# Patient Record
Sex: Male | Born: 1995 | Race: White | Hispanic: No | Marital: Single | Smoking: Never smoker
Health system: Southern US, Community
[De-identification: ages and names within clinical notes are randomized; demographics above are authoritative.]

## PROBLEM LIST (undated history)

## (undated) DIAGNOSIS — J45909 Unspecified asthma, uncomplicated: Secondary | ICD-10-CM

---

## 2018-11-12 ENCOUNTER — Telehealth: Payer: Self-pay

## 2018-11-12 ENCOUNTER — Other Ambulatory Visit: Payer: Self-pay | Admitting: Internal Medicine

## 2018-11-12 DIAGNOSIS — Z20822 Contact with and (suspected) exposure to covid-19: Secondary | ICD-10-CM

## 2018-11-12 NOTE — Telephone Encounter (Signed)
Patient called in requesting MyChart set up assistance - DOB/Address verified - assistance given, patient is able to access MyChart, no further questions.

## 2018-11-13 LAB — NOVEL CORONAVIRUS, NAA: SARS-CoV-2, NAA: DETECTED — AB

## 2019-05-14 ENCOUNTER — Ambulatory Visit: Payer: Self-pay | Attending: Internal Medicine

## 2019-05-14 DIAGNOSIS — Z23 Encounter for immunization: Secondary | ICD-10-CM

## 2019-05-14 NOTE — Progress Notes (Signed)
   Covid-19 Vaccination Clinic  Name:  Adam Woods    MRN: [id number] DOB: 09/07/1995  05/14/2019  Adam Woods was observed post Covid-19 immunization for 15 minutes without incident. He was provided with Vaccine Information Sheet and instruction to access the V-Safe system.   Adam Woods was instructed to call 911 with any severe reactions post vaccine: Marland Kitchen Difficulty breathing  . Swelling of face and throat  . A fast heartbeat  . A bad rash all over body  . Dizziness and weakness   Immunizations Administered    Name Date Dose VIS Date Route   Pfizer COVID-19 Vaccine 05/14/2019  2:07 PM 0.3 mL 01/28/2019 Intramuscular   Manufacturer: [employer], [employer]   Lot: [id number]   NDC: [address]-3

## 2019-06-04 ENCOUNTER — Ambulatory Visit: Payer: Self-pay | Attending: Internal Medicine

## 2019-06-04 DIAGNOSIS — Z23 Encounter for immunization: Secondary | ICD-10-CM

## 2019-12-13 ENCOUNTER — Encounter: Payer: Self-pay | Admitting: *Deleted

## 2019-12-13 ENCOUNTER — Emergency Department: Payer: No Typology Code available for payment source

## 2019-12-13 ENCOUNTER — Emergency Department
Admission: EM | Admit: 2019-12-13 | Discharge: 2019-12-13 | Disposition: A | Discharge status: Home / Self Care | Payer: No Typology Code available for payment source | Attending: Emergency Medicine | Admitting: Emergency Medicine

## 2019-12-13 ENCOUNTER — Other Ambulatory Visit: Payer: Self-pay

## 2019-12-13 DIAGNOSIS — Y92003 Bedroom of unspecified non-institutional (private) residence as the place of occurrence of the external cause: Secondary | ICD-10-CM | POA: Insufficient documentation

## 2019-12-13 DIAGNOSIS — M25562 Pain in left knee: Secondary | ICD-10-CM | POA: Diagnosis not present

## 2019-12-13 DIAGNOSIS — W228XXA Striking against or struck by other objects, initial encounter: Secondary | ICD-10-CM | POA: Diagnosis not present

## 2019-12-13 MED ORDER — TRAMADOL HCL 50 MG PO TABS
50.0000 mg | ORAL_TABLET | Freq: Four times a day (QID) | ORAL | 0 refills | Status: AC | PRN
Start: 2019-12-13 — End: 2020-12-12

## 2019-12-13 MED ORDER — TRAMADOL HCL 50 MG PO TABS
50.0000 mg | ORAL_TABLET | Freq: Once | ORAL | Status: AC
  Administered 2019-12-13: 50 mg via ORAL
  Filled 2019-12-13: qty 1

## 2019-12-13 MED ORDER — IBUPROFEN 800 MG PO TABS
800.0000 mg | ORAL_TABLET | Freq: Once | ORAL | Status: AC
  Administered 2019-12-13: 800 mg via ORAL
  Filled 2019-12-13: qty 1

## 2019-12-13 MED ORDER — IBUPROFEN 800 MG PO TABS: 800.0000 mg | ORAL_TABLET | Freq: Three times a day (TID) | ORAL | 0 refills | Status: AC | PRN

## 2019-12-13 NOTE — ED Triage Notes (Signed)
Pt has left knee pain.  Pt struck knee on bed frame today. Pt reports he tore his acl 6 months ago in left knee.    Pt alert.

## 2019-12-13 NOTE — Discharge Instructions (Signed)
Use crutches and knee immobilizer for comfort. Take pain medications as prescribed. Follow up with orthopedics within 1 week.

## 2019-12-13 NOTE — ED Notes (Signed)
Pt offered crutches but declined, stating he had a pair at home already.

## 2019-12-13 NOTE — ED Provider Notes (Signed)
[facility] Emergency Department Provider Note  ____________________________________________  Time seen: Approximately 4:54 AM  I have reviewed the triage vital signs and the nursing notes.   HISTORY  Chief Complaint Knee Pain   HPI Adam Woods is a 24 y.o. male the history of an ACL sprain who presents for evaluation of acute left knee pain.  Patient reports that earlier today he hit his knee onto the bed frame.  His knee locked up and he heard a pop.  Exactly the same thing that happened to him back in February.  At that time he was seen by orthopedics and had an MRI which showed an ACL sprain but no tear.  Patient is reporting 10 out of 10 sharp constant pain located anteriorly on the left knee which is worse with weightbearing.  He is taken 1500 mg of  Tylenol at 11 PM.  Denies any other injuries.  PMH L ACL sprain  Prior to Admission medications   Medication Sig Start Date End Date Taking? Authorizing Provider  ibuprofen (ADVIL) 800 MG tablet Take 1 tablet (800 mg total) by mouth every 8 (eight) hours as needed. 12/13/19   Nita Sickle, MD  traMADol (ULTRAM) 50 MG tablet Take 1 tablet (50 mg total) by mouth every 6 (six) hours as needed. 12/13/19 12/12/20  Nita Sickle, MD    Allergies Amoxicillin  No family history on file.  Social History Social History   Tobacco Use  . Smoking status: Never Smoker  . Smokeless tobacco: Never Used  Substance Use Topics  . Alcohol use: Not Currently  . Drug use: Not Currently    Review of Systems  Constitutional: Negative for fever. Eyes: Negative for visual changes. ENT: Negative for sore throat. Neck: No neck pain  Cardiovascular: Negative for chest pain. Respiratory: Negative for shortness of breath. Gastrointestinal: Negative for abdominal pain, vomiting or diarrhea. Genitourinary: Negative for dysuria. Musculoskeletal: Negative for back pain. + L knee pain Skin: Negative for  rash. Neurological: Negative for headaches, weakness or numbness. Psych: No SI or HI  ____________________________________________   PHYSICAL EXAM:  VITAL SIGNS: ED Triage Vitals  Enc Vitals Group     BP 12/13/19 0136 (!) 181/111     Pulse Rate 12/13/19 0134 79     Resp 12/13/19 0134 20     Temp 12/13/19 0134 98.9 F (37.2 C)     Temp Source 12/13/19 0134 Oral     SpO2 12/13/19 0134 100 %     Weight 12/13/19 0136 259 lb (117.5 kg)     Height 12/13/19 0136 5\' 10"  (1.778 m)     Head Circumference --      Peak Flow --      Pain Score 12/13/19 0135 8     Pain Loc --      Pain Edu? --      Excl. in GC? --     Constitutional: Alert and oriented. Well appearing and in no apparent distress. HEENT:      Head: Normocephalic and atraumatic.         Eyes: Conjunctivae are normal. Sclera is non-icteric.       Mouth/Throat: Mucous membranes are moist.       Neck: Supple with no signs of meningismus. Cardiovascular: Regular rate and rhythm.  Respiratory: Normal respiratory effort.  Musculoskeletal: Abnormal patella with tenderness to palpation anteriorly, no ligamentous laxity on exam. Strong distal pulses Neurologic: Normal speech and language. Face is symmetric. Moving all extremities. No gross  focal neurologic deficits are appreciated. Skin: Skin is warm, dry and intact. No rash noted. Psychiatric: Mood and affect are normal. Speech and behavior are normal.  ____________________________________________   LABS (all labs ordered are listed, but only abnormal results are displayed)  Labs Reviewed - No data to display ____________________________________________  EKG  none  ____________________________________________  RADIOLOGY  I have personally reviewed the images performed during this visit and I agree with the Radiologist's read.   Interpretation by Radiologist:  DG Knee Complete 4 Views Left  Result Date: 12/13/2019 CLINICAL DATA:  Knee pain EXAM: LEFT KNEE -  COMPLETE 4+ VIEW COMPARISON:  None. FINDINGS: There is an ossicle seen adjacent to the superolateral patellar pole. No acute fracture seen. Trace knee joint effusion is present. IMPRESSION: No acute fracture Ossicle adjacent to the superolateral patella which could represent a bipartite patella versus nonunited prior fracture. Electronically Signed   By: Jonna Clark M.D.   On: 12/13/2019 02:02     ____________________________________________   PROCEDURES  Procedure(s) performed: None Procedures Critical Care performed:  None ____________________________________________   INITIAL IMPRESSION / ASSESSMENT AND PLAN / ED COURSE  24 y.o. male the history of an ACL sprain who presents for evaluation of acute left knee pain.  No ligamentous laxity on exam, patient has tenderness anteriorly.  X-ray visualized by me with no acute dislocation or fracture, confirmed by radiology.  Old medical records reviewed including a visit to orthopedics on February 2021 and the results of the MRI which showed no signs of ligamentous tear.  Patient was placed on a knee immobilizer and given crutches.  Will provide prescription of for 800 mg of ibuprofen and a short course of tramadol.  Work note provided.  Recommended follow-up with his orthopedist.  Discussed my standard return precautions.       _____________________________________________ Please note:  Patient was evaluated in Emergency Department today for the symptoms described in the history of present illness. Patient was evaluated in the context of the global COVID-19 pandemic, which necessitated consideration that the patient might be at risk for infection with the SARS-CoV-2 virus that causes COVID-19. Institutional protocols and algorithms that pertain to the evaluation of patients at risk for COVID-19 are in a state of rapid change based on information released by regulatory bodies including the CDC and federal and state organizations. These policies and  algorithms were followed during the patient's care in the ED.  Some ED evaluations and interventions may be delayed as a result of limited staffing during the pandemic.   [place] Controlled Substance Database was reviewed by me. ____________________________________________   FINAL CLINICAL IMPRESSION(S) / ED DIAGNOSES   Final diagnoses:  Acute pain of left knee      NEW MEDICATIONS STARTED DURING THIS VISIT:  ED Discharge Orders         Ordered    traMADol (ULTRAM) 50 MG tablet  Every 6 hours PRN        12/13/19 0453    ibuprofen (ADVIL) 800 MG tablet  Every 8 hours PRN        12/13/19 0453           Note:  This document was prepared using Dragon voice recognition software and may include unintentional dictation errors.    Don Perking, [place], MD 12/13/19 0500

## 2020-05-22 ENCOUNTER — Other Ambulatory Visit: Payer: Self-pay

## 2020-05-22 ENCOUNTER — Ambulatory Visit
Admission: EM | Admit: 2020-05-22 | Discharge: 2020-05-22 | Disposition: A | Discharge status: Home / Self Care | Payer: No Typology Code available for payment source | Attending: Sports Medicine | Admitting: Sports Medicine

## 2020-05-22 DIAGNOSIS — J069 Acute upper respiratory infection, unspecified: Secondary | ICD-10-CM

## 2020-05-22 DIAGNOSIS — R509 Fever, unspecified: Secondary | ICD-10-CM

## 2020-05-22 DIAGNOSIS — R519 Headache, unspecified: Secondary | ICD-10-CM

## 2020-05-22 DIAGNOSIS — R059 Cough, unspecified: Secondary | ICD-10-CM

## 2020-05-22 DIAGNOSIS — R0981 Nasal congestion: Secondary | ICD-10-CM

## 2020-05-22 MED ORDER — FLUTICASONE PROPIONATE 50 MCG/ACT NA SUSP: 2.0000 | Freq: Every day | NASAL | 0 refills | Status: DC

## 2020-05-22 MED ORDER — BENZONATATE 100 MG PO CAPS: 200.0000 mg | ORAL_CAPSULE | Freq: Three times a day (TID) | ORAL | 0 refills | Status: DC | PRN

## 2020-05-22 NOTE — Discharge Instructions (Signed)
You have a viral upper respiratory infection with headache, nasal congestion and stuffiness, and a dry cough.  He also have a little bit of ear fullness. Please use your albuterol as prescribed. I sent in a medication for your cough called Tessalon Perles.  I also sent in a medication for your nasal congestion called Flonase. Please see your educational handouts are provided.  If your symptoms persist please see your primary care provider. I provided you a work note. If your symptoms worsen please go to the ER.  I hope you get to feeling better, Dr. Zachery Dauer

## 2020-05-22 NOTE — ED Triage Notes (Signed)
Pt reports having dry cough, headache and fever (100.4) and some chest tightness x3-4 days. Also reports having L ear pain.  Took tylenol @6am .

## 2020-05-26 NOTE — ED Provider Notes (Signed)
[facility]    CSN: [id number] Arrival date & time: 05/22/20  0847      History   Chief Complaint Chief Complaint  Patient presents with  . Cough    HPI Adam Woods is a 25 y.o. male.   Patient is a pleasant 25 year old male who presents for evaluation of the above issue.  He works in Pharmacist, hospital.  He is outside and the pollen count is quite high.  His primary care physician is through [facility] in [place] but could not get an appointment.  He reports nasal stuffiness and congestion more on the right side and some left ear fullness off and on for about 5 days.  He also has an associated dry cough nonproductive.  A little bit of a mild headache at times.  He reports a subjective fever to 100.5.  He took Tylenol this morning and is afebrile.  He has a history of asthma and uses albuterol twice a day.  He is not smoking.  He denies chest pain or shortness of breath.  No nausea vomiting or diarrhea.  No abdominal or urinary symptoms.  He has been vaccinated against COVID x2 but no booster.  No flu shot.  No COVID history or Covid exposure.  No red flag signs or symptoms elicited on history.     History reviewed. No pertinent past medical history.  There are no problems to display for this patient.   History reviewed. No pertinent surgical history.     Home Medications    Prior to Admission medications   Medication Sig Start Date End Date Taking? Authorizing Provider  benzonatate (TESSALON) 100 MG capsule Take 2 capsules (200 mg total) by mouth 3 (three) times daily as needed for cough. 05/22/20  Yes Delton See, MD  fluticasone [facility]) 50 MCG/ACT nasal spray Place 2 sprays into both nostrils daily. 05/22/20  Yes Delton See, MD  ibuprofen (ADVIL) 800 MG tablet Take 1 tablet (800 mg total) by mouth every 8 (eight) hours as needed. 12/13/19   Nita Sickle, MD  traMADol (ULTRAM) 50 MG tablet Take 1 tablet (50 mg total) by mouth every 6  (six) hours as needed. 12/13/19 12/12/20  Nita Sickle, MD    Family History No family history on file.  Social History Social History   Tobacco Use  . Smoking status: Never Smoker  . Smokeless tobacco: Never Used  Substance Use Topics  . Alcohol use: Not Currently  . Drug use: Not Currently     Allergies   Amoxicillin   Review of Systems Review of Systems  Constitutional: Positive for fever. Negative for activity change, appetite change, chills, diaphoresis and fatigue.  HENT: Positive for congestion and ear pain. Negative for ear discharge, postnasal drip, rhinorrhea, sinus pressure, sinus pain, sneezing and sore throat.   Eyes: Negative.  Negative for pain.  Respiratory: Positive for cough and chest tightness. Negative for shortness of breath, wheezing and stridor.   Cardiovascular: Negative.  Negative for chest pain and palpitations.  Gastrointestinal: Negative.  Negative for abdominal pain, constipation, diarrhea, nausea and vomiting.  Genitourinary: Negative.  Negative for dysuria, flank pain, frequency and urgency.  Musculoskeletal: Negative.  Negative for arthralgias and myalgias.  Skin: Negative.  Negative for color change, pallor, rash and wound.  Neurological: Positive for headaches. Negative for dizziness, tremors, syncope, weakness and numbness.  All other systems reviewed and are negative.    Physical Exam Triage Vital Signs ED Triage Vitals [05/22/20 0931]  Enc Vitals Group  BP (!) 159/112     Pulse Rate 70     Resp 16     Temp 98.2 F (36.8 C)     Temp Source Oral     SpO2 100 %     Weight 272 lb (123.4 kg)     Height 5\' 10"  (1.778 m)     Head Circumference      Peak Flow      Pain Score 5     Pain Loc      Pain Edu?      Excl. in GC?    No data found.  Updated Vital Signs BP (!) 159/112   Pulse 70   Temp 98.2 F (36.8 C) (Oral)   Resp 16   Ht 5\' 10"  (1.778 m)   Wt 123.4 kg   SpO2 100%   BMI 39.03 kg/m   Visual  Acuity Right Eye Distance:   Left Eye Distance:   Bilateral Distance:    Right Eye Near:   Left Eye Near:    Bilateral Near:     Physical Exam Vitals and nursing note reviewed.  Constitutional:      General: He is not in acute distress.    Appearance: Normal appearance. He is not ill-appearing, toxic-appearing or diaphoretic.  HENT:     Head: Normocephalic and atraumatic.     Right Ear: Tympanic membrane and ear canal normal.     Left Ear: Tympanic membrane and ear canal normal.     Nose: Congestion present.     Right Turbinates: Swollen.     Left Turbinates: Swollen.     Mouth/Throat:     Mouth: Mucous membranes are moist.     Pharynx: No oropharyngeal exudate or posterior oropharyngeal erythema.  Eyes:     General: No scleral icterus.       Right eye: No discharge.        Left eye: No discharge.     Extraocular Movements: Extraocular movements intact.     Conjunctiva/sclera: Conjunctivae normal.     Pupils: Pupils are equal, round, and reactive to light.  Cardiovascular:     Rate and Rhythm: Normal rate and regular rhythm.     Pulses: Normal pulses.     Heart sounds: Normal heart sounds. No murmur heard. No friction rub. No gallop.   Pulmonary:     Effort: Pulmonary effort is normal. No respiratory distress.     Breath sounds: No stridor. No wheezing, rhonchi or rales.  Musculoskeletal:     Cervical back: Normal range of motion and neck supple. No rigidity or tenderness.  Lymphadenopathy:     Cervical: Cervical adenopathy present.  Skin:    General: Skin is warm and dry.     Capillary Refill: Capillary refill takes less than 2 seconds.     Findings: No bruising, erythema, lesion or rash.  Neurological:     General: No focal deficit present.     Mental Status: He is alert and oriented to person, place, and time.      UC Treatments / Results  Labs (all labs ordered are listed, but only abnormal results are displayed) Labs Reviewed - No data to  display  EKG   Radiology No results found.  Procedures Procedures (including critical care time)  Medications Ordered in UC Medications - No data to display  Initial Impression / Assessment and Plan / UC Course  I have reviewed the triage vital signs and the nursing notes.  Pertinent labs &  imaging results that were available during my care of the patient were reviewed by me and considered in my medical decision making (see chart for details).  Clinical impression: Viral upper respiratory infection with cough, nasal congestion, mild intermittent headache, and low-grade fever.  He also has asthma and takes albuterol.  Treatment plan: 1.  The findings and treatment plan were discussed in detail with the patient.  Patient was in agreement. 2.  I had a long discussion with him that an antibiotic was not indicated at this time.  This may change in the future.  It was more than likely a viral process, may be some allergies given the high pollen count. 3.  Recommended he continue with his albuterol twice a day for his asthma. 4.  I prescribed Tessalon Perles and Flonase. 5.  Educational handouts were provided. 6.  I provided him a work note. 7.  If symptoms persisted he should follow-up with his primary care provider.  Certainly if they worsen he should go to the emergency room. 8.  Follow-up here as needed.    Final Clinical Impressions(s) / UC Diagnoses   Final diagnoses:  Viral upper respiratory tract infection  Cough  Nasal congestion  Acute nonintractable headache, unspecified headache type  Fever, unspecified     Discharge Instructions     You have a viral upper respiratory infection with headache, nasal congestion and stuffiness, and a dry cough.  He also have a little bit of ear fullness. Please use your albuterol as prescribed. I sent in a medication for your cough called Tessalon Perles.  I also sent in a medication for your nasal congestion called Flonase. Please  see your educational handouts are provided.  If your symptoms persist please see your primary care provider. I provided you a work note. If your symptoms worsen please go to the ER.  I hope you get to feeling better, Dr. Zachery Dauer     ED Prescriptions    Medication Sig Dispense Auth. Provider   fluticasone (FLONASE) 50 MCG/ACT nasal spray Place 2 sprays into both nostrils daily. 15.8 mL Delton See, MD   benzonatate (TESSALON) 100 MG capsule Take 2 capsules (200 mg total) by mouth 3 (three) times daily as needed for cough. 30 capsule Delton See, MD     PDMP not reviewed this encounter.   Delton See, MD 05/27/20 1343

## 2021-06-27 ENCOUNTER — Ambulatory Visit
Admission: RE | Admit: 2021-06-27 | Discharge: 2021-06-27 | Disposition: A | Discharge status: Home / Self Care | Payer: 59 | Source: Ambulatory Visit

## 2021-06-27 ENCOUNTER — Ambulatory Visit (INDEPENDENT_AMBULATORY_CARE_PROVIDER_SITE_OTHER): Payer: 59

## 2021-06-27 VITALS — BP 157/98 | HR 88 | Temp 98.4°F | Resp 20 | Ht 70.0 in | Wt 304.0 lb

## 2021-06-27 DIAGNOSIS — S60221A Contusion of right hand, initial encounter: Secondary | ICD-10-CM | POA: Diagnosis not present

## 2021-06-27 DIAGNOSIS — M79641 Pain in right hand: Secondary | ICD-10-CM

## 2021-06-27 NOTE — ED Triage Notes (Signed)
Patient is here for "Right hand injury". 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 "This morning can't move hand very well". Seems to be across top of hand, and thumb.  DOI: [id number]. Time: "8pm". No lacerations. No abrasions.  ?

## 2021-06-27 NOTE — ED Provider Notes (Signed)
?[facility] ? ? ? ?CSN: [id number] ?Arrival date & time: 06/27/21  1042 ? ? ?  ? ?History   ?Chief Complaint ?Chief Complaint  ?Patient presents with  ? Hand Problem  ?  Entered by patient  ? ? ?HPI ?Adam Woods is a 26 y.o. male.  ? ?HPI ? ?26 year old male here for evaluation of right hand injury. ? ?Patient reports that he was under his vehicle working on it when the brake caliper snapshot pending his hand between the rotor and the brake pad.  He is complaining of pain across all MCP joints of the second through fifth fingers and in the webspace of the thumb.  He has decreased range of motion secondary to pain and swelling.  He also endorses some tingling in his fingers.  He denies any numbness. ? ?History reviewed. No pertinent past medical history. ? ?There are no problems to display for this patient. ? ? ?History reviewed. No pertinent surgical history. ? ? ? ? ?Home Medications   ? ?Prior to Admission medications   ?Medication Sig Start Date End Date Taking? Authorizing Provider  ?albuterol (VENTOLIN HFA) 108 (90 Base) MCG/ACT inhaler albuterol sulfate HFA 90 mcg/actuation aerosol inhaler 01/07/19  Yes [provider]  ?FLUoxetine (PROZAC) 20 MG capsule fluoxetine 20 mg capsule 01/07/19  Yes [provider]  ?meloxicam (MOBIC) 15 MG tablet Take by mouth. 03/29/21  Yes [provider]  ?meloxicam (MOBIC) 7.5 MG tablet meloxicam 7.5 mg tablet 01/07/19  Yes [provider]  ?montelukast (SINGULAIR) 10 MG tablet montelukast 10 mg tablet 01/07/19  Yes [provider]  ?traMADol (ULTRAM) 50 MG tablet tramadol 50 mg tablet ? TAKE 1 TABLET BY MOUTH EVERY 8 HOURS 04/06/19  Yes [provider]  ?traZODone (DESYREL) 100 MG tablet trazodone 100 mg tablet 01/07/19  Yes [provider]  ?amoxicillin-clavulanate (AUGMENTIN) 875-125 MG tablet amoxicillin 875 mg-potassium clavulanate 125 mg tablet    [provider]  ?benzonatate (TESSALON)  100 MG capsule Take 2 capsules (200 mg total) by mouth 3 (three) times daily as needed for cough. 05/22/20   Delton See, MD  ?benzonatate (TESSALON) 200 MG capsule benzonatate 200 mg capsule    [provider]  ?cyclobenzaprine (FLEXERIL) 5 MG tablet  04/12/21   [provider]  ?diclofenac (VOLTAREN) 75 MG EC tablet  04/12/21   [provider]  ?fluticasone (FLONASE) 50 MCG/ACT nasal spray Place 2 sprays into both nostrils daily. 05/22/20   Delton See, MD  ?ibuprofen (ADVIL) 800 MG tablet Take 1 tablet (800 mg total) by mouth every 8 (eight) hours as needed. 12/13/19   Nita Sickle, MD  ?meloxicam [facility]) 15 MG tablet  04/12/21   [provider]  ?methylPREDNISolone (MEDROL DOSEPAK) 4 MG TBPK tablet  04/12/21   [provider]  ?montelukast (SINGULAIR) 10 MG tablet SMARTSIG:1 Tablet(s) By Mouth Every Evening 06/17/21   [provider]  ?Multiple Vitamin (MULTI-VITAMIN) tablet Take 1 tablet by mouth daily.    [provider]  ?Multiple Vitamin (MULTI-VITAMIN) tablet Take by mouth.    [provider]  ?olopatadine (PATANOL) 0.1 % ophthalmic solution  04/12/21   [provider]  ?oxyCODONE-acetaminophen (PERCOCET/ROXICET) 5-325 MG tablet oxycodone-acetaminophen 5 mg-325 mg tablet    [provider]  ?predniSONE (DELTASONE) 50 MG tablet prednisone 50 mg tablet    [provider]  ?promethazine (PHENERGAN) 6.25 MG/5ML syrup  04/12/21   [provider]  ?sulfamethoxazole-trimethoprim (BACTRIM DS) 800-160 MG tablet sulfamethoxazole 800  mg-trimethoprim 160 mg tablet    [provider]  ? ? ?Family History ?No family history on file. ? ?Social History ?Social History  ? ?Tobacco Use  ? Smoking status: Never  ? Smokeless tobacco: Never  ?Vaping Use  ? Vaping Use: Never used  ?Substance Use Topics  ? Alcohol use: Not Currently  ? Drug use: Not Currently  ? ? ? ?Allergies   ?Amoxicillin, Azithromycin,  Clarithromycin, Erythromycin, and Clindamycin ? ? ?Review of Systems ?Review of Systems  ?Musculoskeletal:  Positive for arthralgias and joint swelling.  ?Skin:  Negative for color change and wound.  ?Neurological:  Negative for weakness and numbness.  ?Hematological: Negative.   ?Psychiatric/Behavioral: Negative.    ? ? ?Physical Exam ?Triage Vital Signs ?ED Triage Vitals  ?Enc Vitals Group  ?   BP 06/27/21 1105 (!) 157/98  ?   Pulse Rate 06/27/21 1105 88  ?   Resp 06/27/21 1105 20  ?   Temp 06/27/21 1105 98.4 ?F (36.9 ?C)  ?   Temp Source 06/27/21 1105 Oral  ?   SpO2 06/27/21 1105 98 %  ?   Weight 06/27/21 1103 (!) 304 lb (137.9 kg)  ?   Height 06/27/21 1103 5\' 10"  (1.778 m)  ?   Head Circumference --   ?   Peak Flow --   ?   Pain Score 06/27/21 1101 7  ?   Pain Loc --   ?   Pain Edu? --   ?   Excl. in GC? --   ? ?No data found. ? ?Updated Vital Signs ?BP (!) 157/98 (BP Location: Left Arm)   Pulse 88   Temp 98.4 ?F (36.9 ?C) (Oral)   Resp 20   Ht 5\' 10"  (1.778 m)   Wt (!) 304 lb (137.9 kg)   SpO2 98%   BMI 43.62 kg/m?  ? ?Visual Acuity ?Right Eye Distance:   ?Left Eye Distance:   ?Bilateral Distance:   ? ?Right Eye Near:   ?Left Eye Near:    ?Bilateral Near:    ? ?Physical Exam ?Vitals and nursing note reviewed.  ?Constitutional:   ?   Appearance: Normal appearance. He is not ill-appearing.  ?HENT:  ?   Head: Normocephalic and atraumatic.  ?Musculoskeletal:     ?   General: Swelling and tenderness present. No deformity or signs of injury.  ?Skin: ?   General: Skin is warm and dry.  ?   Capillary Refill: Capillary refill takes less than 2 seconds.  ?   Findings: No bruising or erythema.  ?Neurological:  ?   General: No focal deficit present.  ?   Mental Status: He is alert and oriented to person, place, and time.  ?Psychiatric:     ?   Mood and Affect: Mood normal.     ?   Behavior: Behavior normal.     ?   Thought Content: Thought content normal.     ?   Judgment: Judgment normal.  ? ? ? ?UC Treatments /  Results  ?Labs ?(all labs ordered are listed, but only abnormal results are displayed) ?Labs Reviewed - No data to display ? ?EKG ? ? ?Radiology ?DG Hand Complete Right ? ?Result Date: 06/27/2021 ?CLINICAL DATA:  RIGHT hand injury, pain EXAM: RIGHT HAND - COMPLETE 3+ VIEW COMPARISON:  None FINDINGS: Osseous mineralization normal. Joint spaces preserved. No acute fracture, dislocation, or bone destruction. IMPRESSION: No acute osseous abnormalities. Electronically Signed   By: .D.  On: 06/27/2021 11:27   ? ?Procedures ?Procedures (including critical care time) ? ?Medications Ordered in UC ?Medications - No data to display ? ?Initial Impression / Assessment and Plan / UC Course  ?I have reviewed the triage vital signs and the nursing notes. ? ?Pertinent labs & imaging results that were available during my care of the patient were reviewed by me and considered in my medical decision making (see chart for details). ? ?Patient is a very pleasant, nontoxic-appearing 26 year old male here for evaluation of right hand injury as outlined in HPI above.  Patient reports that his hand was crushed between a rotor and brake pads when the caliper slammed shut while he was working on his vehicle.  Physical exam reveals a right hand is in normal anatomical alignment but there is swelling over the MCP joints of the second through fifth finger.  Patient also complaining of pain in the webspace between the thumb and index finger but there is no edema noted there.  Also no ecchymosis or erythema appreciated.  No pain in distal aspect of any of his phalanges and his cap refill is less than 2 seconds.  Patient has good strength with splaying his fingers and also with flexion extension of his thumb.  He also has normal sensation distal to the injury.  No pain with palpation of the thenar or hypothenar aspect of the palm.  Radial ulnar pulses are 2+.  Radiograph of right hand was obtained at triage. ? ?Right hand x-ray  independently reviewed and evaluated by me.  Impression: There is no evidence of fracture or dislocation.  There is some soft tissue swelling evident over the distal hand at the level of the MCP joints.  Radiology overread i

## 2021-06-27 NOTE — Discharge Instructions (Signed)
Rest your hand as much as possible and keep it elevated to combat swelling. ? ?Apply ice to your hand for 20 minutes at a time, 2-3 times a day to help with swelling and pain. ? ?Use OTC Ibuprofen according to the package instructions as needed for pain. ? ?Follow the hand exercises given at discharge to help maintain mobility in your hand and fingers. ? ?If your symptoms are not improving return for re-evaluation. ?

## 2021-08-12 ENCOUNTER — Emergency Department: Payer: 59

## 2021-08-12 ENCOUNTER — Other Ambulatory Visit: Payer: Self-pay

## 2021-08-12 ENCOUNTER — Emergency Department
Admission: EM | Admit: 2021-08-12 | Discharge: 2021-08-12 | Disposition: A | Discharge status: Home / Self Care | Payer: 59 | Attending: Emergency Medicine | Admitting: Emergency Medicine

## 2021-08-12 ENCOUNTER — Encounter: Payer: Self-pay | Admitting: Medical Oncology

## 2021-08-12 DIAGNOSIS — R202 Paresthesia of skin: Secondary | ICD-10-CM | POA: Insufficient documentation

## 2021-08-12 DIAGNOSIS — R791 Abnormal coagulation profile: Secondary | ICD-10-CM | POA: Insufficient documentation

## 2021-08-12 DIAGNOSIS — R7989 Other specified abnormal findings of blood chemistry: Secondary | ICD-10-CM

## 2021-08-12 DIAGNOSIS — R7309 Other abnormal glucose: Secondary | ICD-10-CM | POA: Diagnosis not present

## 2021-08-12 DIAGNOSIS — E8889 Other specified metabolic disorders: Secondary | ICD-10-CM | POA: Diagnosis not present

## 2021-08-12 DIAGNOSIS — J45909 Unspecified asthma, uncomplicated: Secondary | ICD-10-CM | POA: Insufficient documentation

## 2021-08-12 DIAGNOSIS — I10 Essential (primary) hypertension: Secondary | ICD-10-CM | POA: Diagnosis not present

## 2021-08-12 DIAGNOSIS — R079 Chest pain, unspecified: Secondary | ICD-10-CM

## 2021-08-12 DIAGNOSIS — M549 Dorsalgia, unspecified: Secondary | ICD-10-CM | POA: Diagnosis not present

## 2021-08-12 DIAGNOSIS — R0789 Other chest pain: Secondary | ICD-10-CM | POA: Diagnosis present

## 2021-08-12 HISTORY — DX: Unspecified asthma, uncomplicated: J45.909

## 2021-08-12 LAB — BASIC METABOLIC PANEL
Anion gap: 8 (ref 5–15)
BUN: 12 mg/dL (ref 6–20)
CO2: 24 mmol/L (ref 22–32)
Calcium: 9.1 mg/dL (ref 8.9–10.3)
Chloride: 104 mmol/L (ref 98–111)
Creatinine, Ser: 0.86 mg/dL (ref 0.61–1.24)
GFR, Estimated: >60 mL/min (ref >60)
Glucose, Bld: 145 mg/dL — ABNORMAL HIGH (ref 70–99)
Potassium: 3.6 mmol/L (ref 3.5–5.1)
Sodium: 136 mmol/L (ref 135–145)

## 2021-08-12 LAB — CBC
HCT: 43.7 % (ref 39.0–52.0)
Hemoglobin: 14.6 g/dL (ref 13.0–17.0)
MCH: 27.2 pg (ref 26.0–34.0)
MCHC: 33.4 g/dL (ref 30.0–36.0)
MCV: 81.4 fL (ref 80.0–100.0)
Platelets: 192 10*3/uL (ref 150–400)
RBC: 5.37 MIL/uL (ref 4.22–5.81)
RDW: 13 % (ref 11.5–15.5)
WBC: 7.7 10*3/uL (ref 4.0–10.5)
nRBC: 0 % (ref 0.0–0.2)

## 2021-08-12 LAB — TROPONIN I (HIGH SENSITIVITY)
Troponin I (High Sensitivity): <2 ng/L (ref <18)
Troponin I (High Sensitivity): 3 ng/L (ref <18)

## 2021-08-12 LAB — D-DIMER, QUANTITATIVE: D-Dimer, Quant: 0.65 ug/mL-FEU — ABNORMAL HIGH (ref 0.00–0.50)

## 2021-08-12 MED ORDER — KETOROLAC TROMETHAMINE 30 MG/ML IJ SOLN
15.0000 mg | Freq: Once | INTRAMUSCULAR | Status: AC
  Administered 2021-08-12: 15 mg via INTRAVENOUS
  Filled 2021-08-12: qty 1

## 2021-08-12 MED ORDER — IOHEXOL 350 MG/ML SOLN
75.0000 mL | Freq: Once | INTRAVENOUS | Status: AC | PRN
  Administered 2021-08-12: 75 mL via INTRAVENOUS

## 2021-08-12 MED ORDER — ACETAMINOPHEN 500 MG PO TABS
1000.0000 mg | ORAL_TABLET | Freq: Once | ORAL | Status: AC
  Administered 2021-08-12: 1000 mg via ORAL
  Filled 2021-08-12: qty 2

## 2021-11-06 IMAGING — CR DG KNEE COMPLETE 4+V*L*
1 series · 4 of 4 positions shown · non-contrast
Comparison: None.

CLINICAL DATA: Knee pain

EXAM:
LEFT KNEE - COMPLETE 4+ VIEW

[Series 1: dg knee complete 4 views left · 0.14mm/px · 4 of 4 slices shown]
[im 1/4]
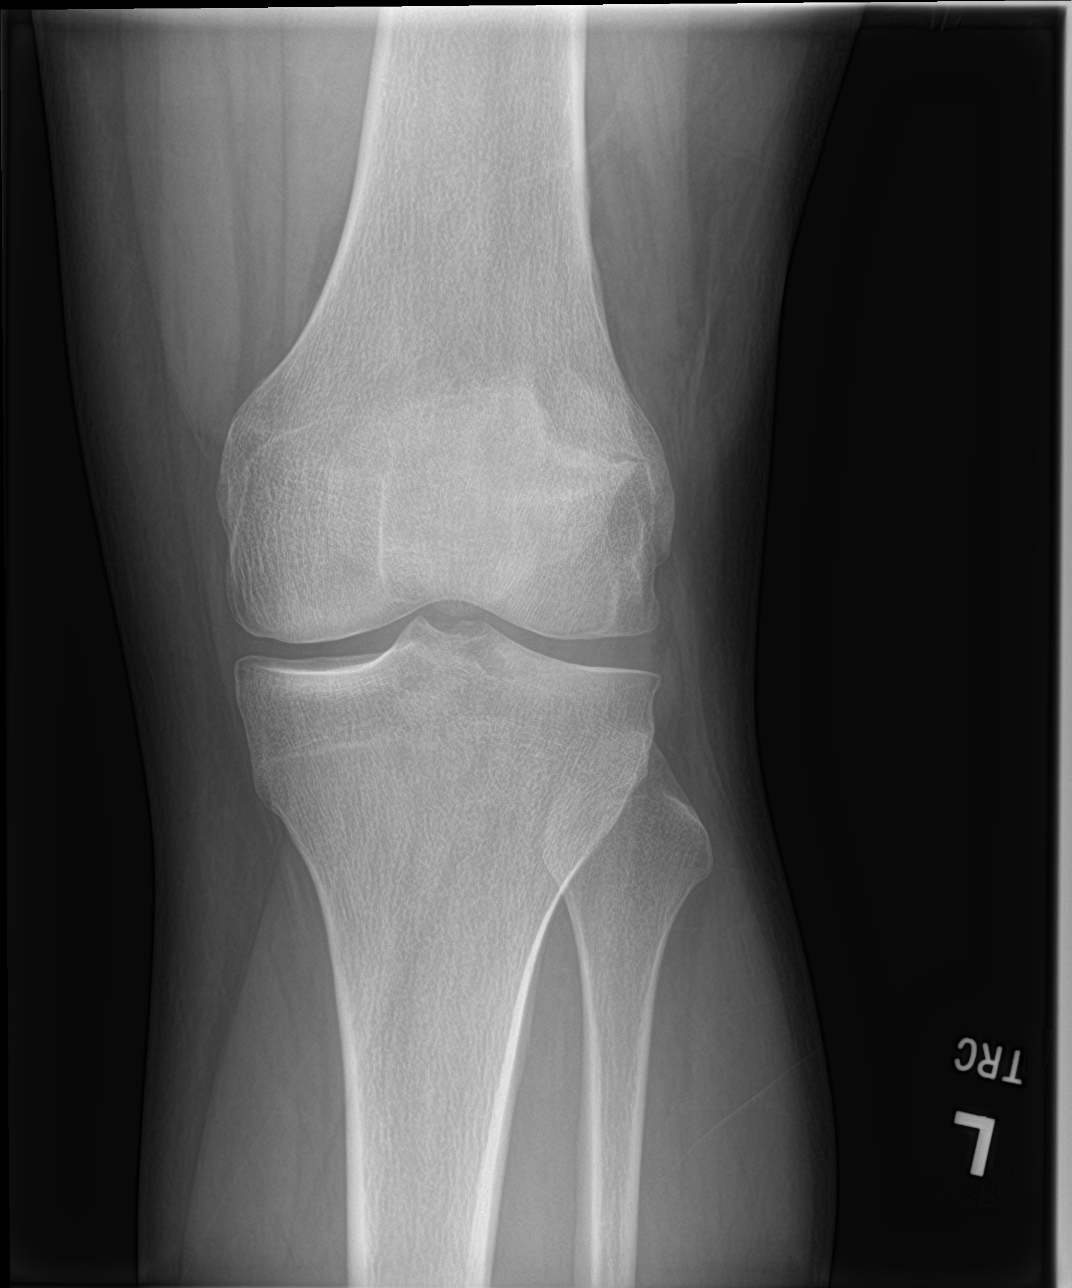
[im 2/4]
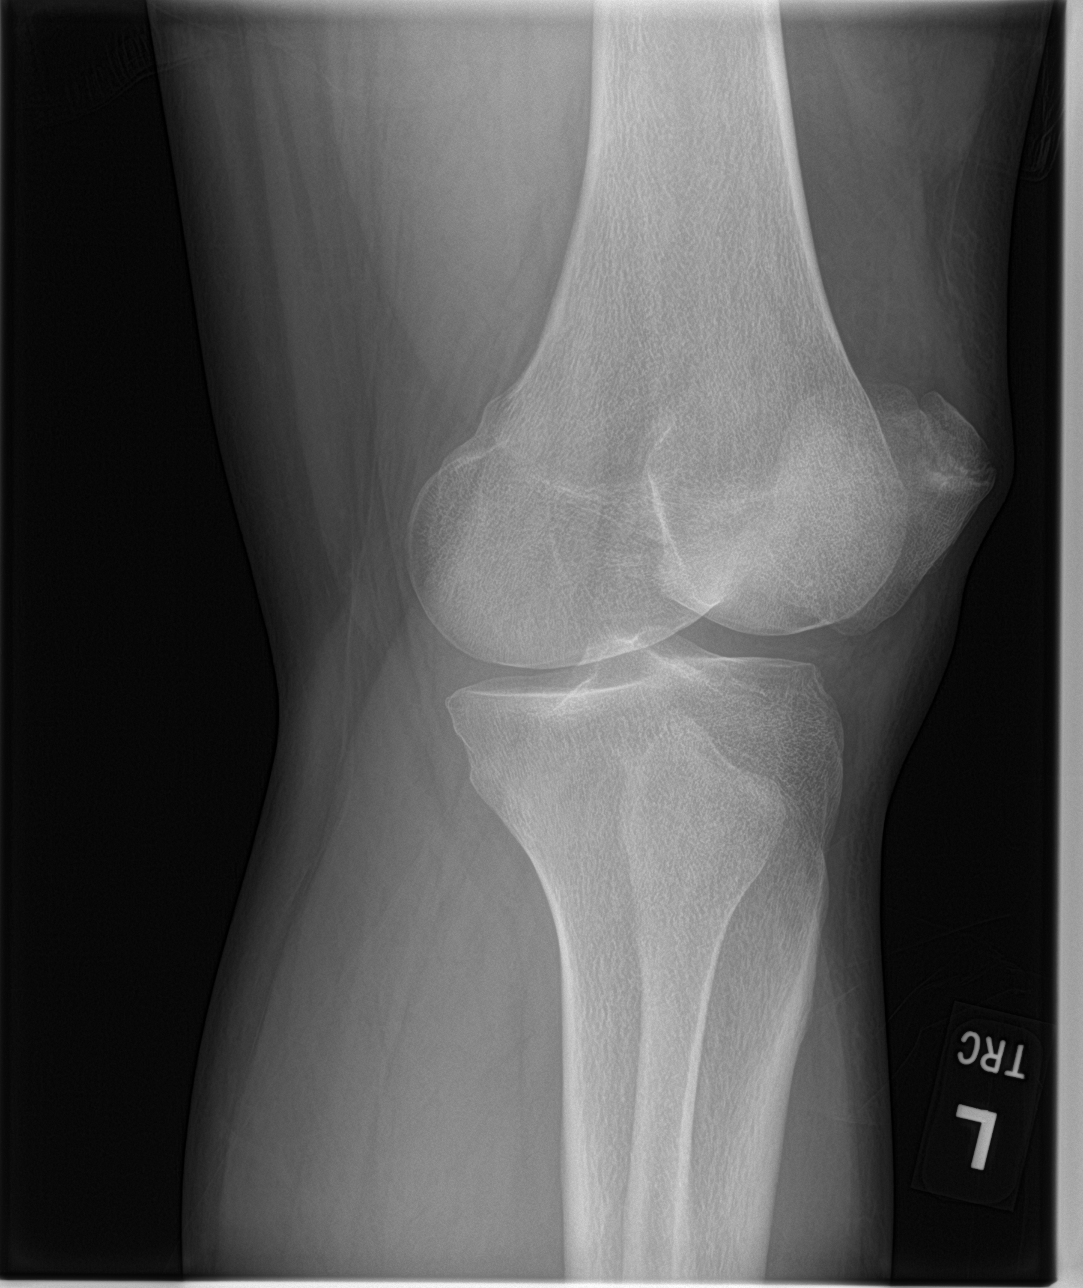
[im 3/4]
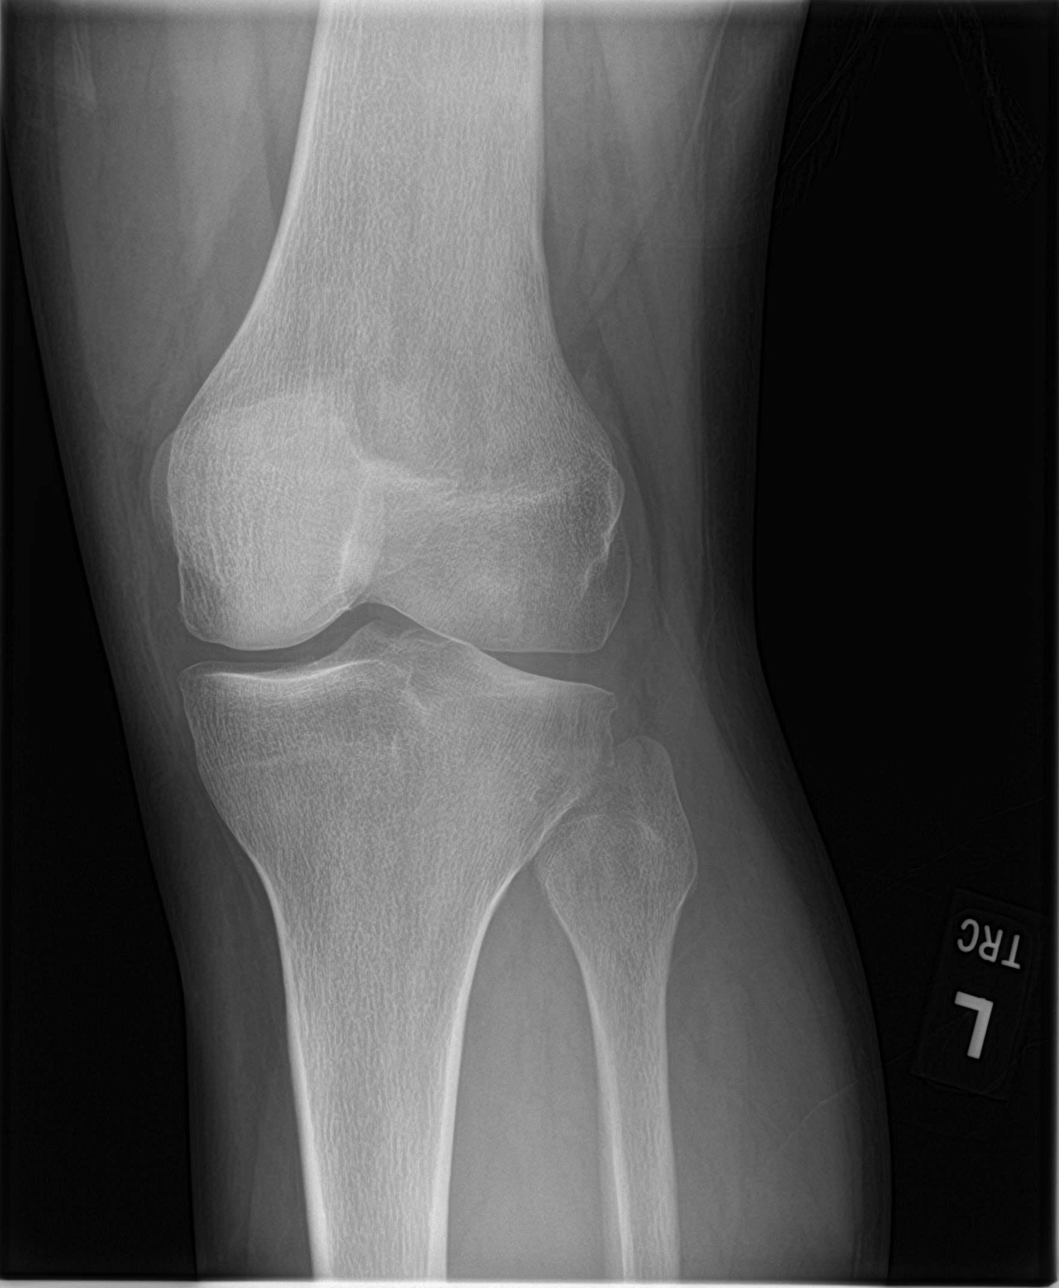
[im 4/4]
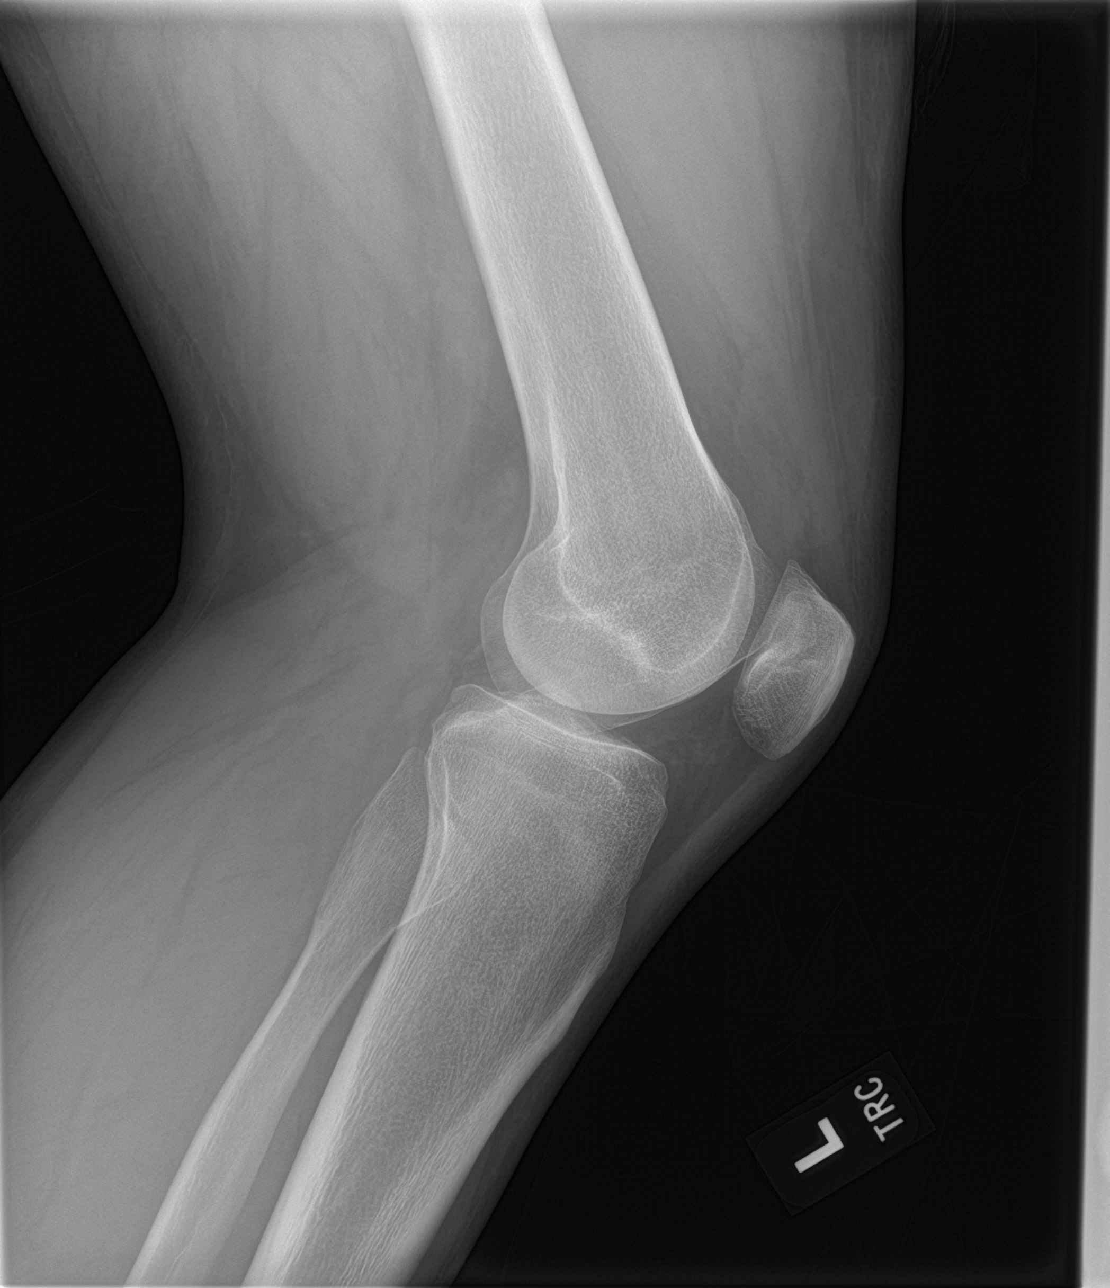

[4 of 4 positions shown; findings below may reference images not displayed]

FINDINGS: There is an ossicle seen adjacent to the superolateral patellar
pole. No acute fracture seen. Trace knee joint effusion is present.
IMPRESSION: No acute fracture

Ossicle adjacent to the superolateral patella which could represent
a bipartite patella versus nonunited prior fracture.

## 2021-12-02 ENCOUNTER — Ambulatory Visit: Payer: Self-pay | Admitting: Internal Medicine

## 2021-12-02 NOTE — Progress Notes (Deleted)
HPI  Patient presents to clinic today to establish care and for management of the conditions listed below.  Asthma: Mild, intermittent.  Managed on montelukast and albuterol as needed.  There are no PFTs on file.  Anxiety and Depression: Chronic, managed on Fluoxetine.  He is not currently seeing a therapist.  He denies SI/HI.  Insomnia: He has difficulty.  He is taking Trazodone as prescribed.  There is no sleep study on file.  Past Medical History:  Diagnosis Date   Asthma     Current Outpatient Medications  Medication Sig Dispense Refill   albuterol (VENTOLIN HFA) 108 (90 Base) MCG/ACT inhaler albuterol sulfate HFA 90 mcg/actuation aerosol inhaler     FLUoxetine (PROZAC) 20 MG capsule fluoxetine 20 mg capsule     ibuprofen (ADVIL) 800 MG tablet Take 1 tablet (800 mg total) by mouth every 8 (eight) hours as needed. 30 tablet 0   montelukast (SINGULAIR) 10 MG tablet SMARTSIG:1 Tablet(s) By Mouth Every Evening     olopatadine (PATANOL) 0.1 % ophthalmic solution      traZODone (DESYREL) 100 MG tablet trazodone 100 mg tablet     No current facility-administered medications for this visit.    Allergies  Allergen Reactions   Amoxicillin Anaphylaxis, Hives and Shortness Of Breath   Azithromycin Anaphylaxis   Clarithromycin Anaphylaxis   Erythromycin Anaphylaxis   Clindamycin Palpitations    No family history on file.  Social History   Socioeconomic History   Marital status: Single    Spouse name: Not on file   Number of children: Not on file   Years of education: Not on file   Highest education level: Not on file  Occupational History   Not on file  Tobacco Use   Smoking status: Never   Smokeless tobacco: Never  Vaping Use   Vaping Use: Never used  Substance and Sexual Activity   Alcohol use: Not Currently   Drug use: Not Currently   Sexual activity: Not on file  Other Topics Concern   Not on file  Social History Narrative   Not on file   Social Determinants  of Health   Financial Resource Strain: Not on file  Food Insecurity: Not on file  Transportation Needs: Not on file  Physical Activity: Not on file  Stress: Not on file  Social Connections: Not on file  Intimate Partner Violence: Not on file    ROS:  Constitutional: Denies fever, malaise, fatigue, headache or abrupt weight changes.  HEENT: Denies eye pain, eye redness, ear pain, ringing in the ears, wax buildup, runny nose, nasal congestion, bloody nose, or sore throat. Respiratory: Denies difficulty breathing, shortness of breath, cough or sputum production.   Cardiovascular: Denies chest pain, chest tightness, palpitations or swelling in the hands or feet.  Gastrointestinal: Denies abdominal pain, bloating, constipation, diarrhea or blood in the stool.  GU: Denies frequency, urgency, pain with urination, blood in urine, odor or discharge. Musculoskeletal: Denies decrease in range of motion, difficulty with gait, muscle pain or joint pain and swelling.  Skin: Denies redness, rashes, lesions or ulcercations.  Neurological: Patient reports insomnia.  Denies dizziness, difficulty with memory, difficulty with speech or problems with balance and coordination.  Psych: Patient has a history of anxiety and depression.  Denies anxiety, depression, No other specific complaints in a complete review of systems (except as listed in HPI above).  PE:  There were no vitals taken for this visit. Wt Readings from Last 3 Encounters:  08/12/21 299 lb 13.2  oz (136 kg)  06/27/21 (!) 304 lb (137.9 kg)  05/22/20 272 lb (123.4 kg)    General: Appears their stated age, well developed, well nourished in NAD. HEENT: Head: normal shape and size; Eyes: sclera white, no icterus, conjunctiva pink, PERRLA and EOMs intact; Ears: Tm's gray and intact, normal light reflex;Throat/Mouth: Teeth present, mucosa pink and moist, no lesions or ulcerations noted.  Neck: Neck supple, trachea midline. No masses, lumps or  thyromegaly present.  Cardiovascular: Normal rate and rhythm. S1,S2 noted.  No murmur, rubs or gallops noted. No JVD or BLE edema. No carotid bruits noted. Pulmonary/Chest: Normal effort and positive vesicular breath sounds. No respiratory distress. No wheezes, rales or ronchi noted.  Abdomen: Soft and nontender. Normal bowel sounds, no bruits noted. No distention or masses noted. Liver, spleen and kidneys non palpable. Musculoskeletal: Normal range of motion. Strength 5/5 BUE/BLE. No signs of joint swelling. No difficulty with gait.  Neurological: Alert and oriented. Cranial nerves II-XII grossly intact. Coordination normal.  Psychiatric: Mood and affect normal. Behavior is normal. Judgment and thought content normal.     BMET    Component Value Date/Time   NA 136 08/12/2021 1329   K 3.6 08/12/2021 1329   CL 104 08/12/2021 1329   CO2 24 08/12/2021 1329   GLUCOSE 145 (H) 08/12/2021 1329   BUN 12 08/12/2021 1329   CREATININE 0.86 08/12/2021 1329   CALCIUM 9.1 08/12/2021 1329   GFRNONAA >60 08/12/2021 1329    Lipid Panel  No results found for: "CHOL", "TRIG", "HDL", "CHOLHDL", "VLDL", "LDLCALC"  CBC    Component Value Date/Time   WBC 7.7 08/12/2021 1329   RBC 5.37 08/12/2021 1329   HGB 14.6 08/12/2021 1329   HCT 43.7 08/12/2021 1329   PLT 192 08/12/2021 1329   MCV 81.4 08/12/2021 1329   MCH 27.2 08/12/2021 1329   MCHC 33.4 08/12/2021 1329   RDW 13.0 08/12/2021 1329    Hgb A1C No results found for: "HGBA1C"   Assessment and Plan:    RTC in 6 months for annual exam Nicki Reaper, NP
